# Patient Record
Sex: Male | Born: 1975 | Race: White | Hispanic: No | Marital: Married | Smoking: Never smoker
Health system: Southern US, Community
[De-identification: ages and names within clinical notes are randomized; demographics above are authoritative.]

## PROBLEM LIST (undated history)

## (undated) DIAGNOSIS — G4730 Sleep apnea, unspecified: Secondary | ICD-10-CM

## (undated) DIAGNOSIS — R030 Elevated blood-pressure reading, without diagnosis of hypertension: Secondary | ICD-10-CM

## (undated) DIAGNOSIS — M1990 Unspecified osteoarthritis, unspecified site: Secondary | ICD-10-CM

## (undated) DIAGNOSIS — K429 Umbilical hernia without obstruction or gangrene: Secondary | ICD-10-CM

## (undated) DIAGNOSIS — R519 Headache, unspecified: Secondary | ICD-10-CM

## (undated) DIAGNOSIS — J45909 Unspecified asthma, uncomplicated: Secondary | ICD-10-CM

## (undated) HISTORY — PX: BACK SURGERY: SHX140

---

## 2004-11-19 ENCOUNTER — Ambulatory Visit: Payer: Self-pay | Admitting: Family Medicine

## 2006-10-22 ENCOUNTER — Ambulatory Visit: Payer: Self-pay | Admitting: Family Medicine

## 2006-10-27 ENCOUNTER — Ambulatory Visit: Payer: Self-pay | Admitting: Family Medicine

## 2007-02-18 ENCOUNTER — Ambulatory Visit: Payer: Self-pay

## 2015-01-01 ENCOUNTER — Other Ambulatory Visit: Payer: Self-pay | Admitting: Family Medicine

## 2015-01-01 DIAGNOSIS — M5442 Lumbago with sciatica, left side: Secondary | ICD-10-CM

## 2015-01-08 ENCOUNTER — Ambulatory Visit
Admission: RE | Admit: 2015-01-08 | Discharge: 2015-01-08 | Disposition: A | Discharge status: Home / Self Care | Payer: Managed Care, Other (non HMO) | Source: Ambulatory Visit | Attending: Family Medicine | Admitting: Family Medicine

## 2015-01-08 DIAGNOSIS — M5126 Other intervertebral disc displacement, lumbar region: Secondary | ICD-10-CM | POA: Diagnosis not present

## 2015-01-08 DIAGNOSIS — M5442 Lumbago with sciatica, left side: Secondary | ICD-10-CM | POA: Diagnosis present

## 2015-09-23 ENCOUNTER — Ambulatory Visit
Admission: RE | Admit: 2015-09-23 | Discharge: 2015-09-23 | Disposition: A | Discharge status: Home / Self Care | Payer: Managed Care, Other (non HMO) | Source: Ambulatory Visit | Attending: Unknown Physician Specialty | Admitting: Unknown Physician Specialty

## 2015-09-23 ENCOUNTER — Other Ambulatory Visit: Payer: Self-pay | Admitting: Unknown Physician Specialty

## 2015-09-23 DIAGNOSIS — R1032 Left lower quadrant pain: Secondary | ICD-10-CM | POA: Diagnosis not present

## 2015-09-23 DIAGNOSIS — K4090 Unilateral inguinal hernia, without obstruction or gangrene, not specified as recurrent: Secondary | ICD-10-CM | POA: Insufficient documentation

## 2015-09-23 DIAGNOSIS — R918 Other nonspecific abnormal finding of lung field: Secondary | ICD-10-CM | POA: Diagnosis not present

## 2015-09-23 DIAGNOSIS — K760 Fatty (change of) liver, not elsewhere classified: Secondary | ICD-10-CM | POA: Diagnosis not present

## 2015-09-23 MED ORDER — IOHEXOL 300 MG/ML  SOLN
100.0000 mL | Freq: Once | INTRAMUSCULAR | Status: AC | PRN
Start: 2015-09-23 — End: 2015-09-23
  Administered 2015-09-23: 100 mL via INTRAVENOUS

## 2018-09-27 DIAGNOSIS — J069 Acute upper respiratory infection, unspecified: Secondary | ICD-10-CM | POA: Diagnosis not present

## 2019-01-05 ENCOUNTER — Other Ambulatory Visit: Payer: Self-pay | Admitting: Student

## 2019-01-05 DIAGNOSIS — M25561 Pain in right knee: Secondary | ICD-10-CM

## 2019-01-05 DIAGNOSIS — M25461 Effusion, right knee: Secondary | ICD-10-CM

## 2019-01-17 ENCOUNTER — Ambulatory Visit
Admission: RE | Admit: 2019-01-17 | Discharge: 2019-01-17 | Disposition: A | Discharge status: Home / Self Care | Payer: Commercial Managed Care - PPO | Source: Ambulatory Visit | Attending: Student | Admitting: Student

## 2019-01-17 ENCOUNTER — Other Ambulatory Visit: Payer: Self-pay

## 2019-01-17 DIAGNOSIS — M25461 Effusion, right knee: Secondary | ICD-10-CM | POA: Insufficient documentation

## 2019-01-17 DIAGNOSIS — M25561 Pain in right knee: Secondary | ICD-10-CM | POA: Diagnosis present

## 2019-02-16 ENCOUNTER — Other Ambulatory Visit
Admission: RE | Admit: 2019-02-16 | Discharge: 2019-02-16 | Disposition: A | Discharge status: Home / Self Care | Payer: Commercial Managed Care - PPO | Source: Ambulatory Visit | Attending: Sports Medicine | Admitting: Sports Medicine

## 2019-02-16 DIAGNOSIS — M25461 Effusion, right knee: Secondary | ICD-10-CM | POA: Diagnosis not present

## 2019-02-16 LAB — SYNOVIAL CELL COUNT + DIFF, W/ CRYSTALS
Crystals, Fluid: NONE SEEN
Eosinophils-Synovial: 0 %
Lymphocytes-Synovial Fld: 80 %
Monocyte-Macrophage-Synovial Fluid: 10 %
Neutrophil, Synovial: 10 %
WBC, Synovial: 66 /mm3 (ref 0–200)

## 2019-02-20 LAB — BODY FLUID CULTURE: Culture: NO GROWTH

## 2019-03-03 ENCOUNTER — Encounter
Admission: RE | Admit: 2019-03-03 | Discharge: 2019-03-03 | Disposition: A | Discharge status: Home / Self Care | Payer: Commercial Managed Care - PPO | Source: Ambulatory Visit | Attending: Orthopedic Surgery | Admitting: Orthopedic Surgery

## 2019-03-03 ENCOUNTER — Other Ambulatory Visit: Payer: Self-pay

## 2019-03-03 DIAGNOSIS — Z01818 Encounter for other preprocedural examination: Secondary | ICD-10-CM | POA: Insufficient documentation

## 2019-03-03 DIAGNOSIS — I4510 Unspecified right bundle-branch block: Secondary | ICD-10-CM | POA: Insufficient documentation

## 2019-03-03 DIAGNOSIS — R030 Elevated blood-pressure reading, without diagnosis of hypertension: Secondary | ICD-10-CM | POA: Insufficient documentation

## 2019-03-03 HISTORY — DX: Sleep apnea, unspecified: G47.30

## 2019-03-03 HISTORY — DX: Elevated blood-pressure reading, without diagnosis of hypertension: R03.0

## 2019-03-03 HISTORY — DX: Unspecified osteoarthritis, unspecified site: M19.90

## 2019-03-03 HISTORY — DX: Umbilical hernia without obstruction or gangrene: K42.9

## 2019-03-03 HISTORY — DX: Headache, unspecified: R51.9

## 2019-03-03 HISTORY — DX: Unspecified asthma, uncomplicated: J45.909

## 2019-03-03 LAB — BASIC METABOLIC PANEL
Anion gap: 8 (ref 5–15)
BUN: 10 mg/dL (ref 6–20)
CO2: 24 mmol/L (ref 22–32)
Calcium: 8.8 mg/dL — ABNORMAL LOW (ref 8.9–10.3)
Chloride: 104 mmol/L (ref 98–111)
Creatinine, Ser: 0.78 mg/dL (ref 0.61–1.24)
GFR calc Af Amer: >60 mL/min (ref >60)
GFR calc non Af Amer: >60 mL/min (ref >60)
Glucose, Bld: 121 mg/dL — ABNORMAL HIGH (ref 70–99)
Potassium: 3.7 mmol/L (ref 3.5–5.1)
Sodium: 136 mmol/L (ref 135–145)

## 2019-03-03 NOTE — Patient Instructions (Signed)
Your procedure is scheduled on: 03-10-19 FRIDAY Report to Same Day Surgery 2nd floor medical mall [facility] Entrance-take elevator on left to 2nd floor.  Check in with surgery information desk.) To find out your arrival time please call [phone] between 1PM - 3PM on 03-09-19 THURSDAY  Remember: Instructions that are not followed completely may result in serious medical risk, up to and including death, or upon the discretion of your surgeon and anesthesiologist your surgery may need to be rescheduled.    _x___ 1. Do not eat food after midnight the night before your procedure. NO GUM OR CANDY AFTER MIDNIGHT. You may drink clear liquids up to 2 hours before you are scheduled to arrive at the hospital for your procedure.  Do not drink clear liquids within 2 hours of your scheduled arrival to the hospital.  Clear liquids include  --Water or Apple juice without pulp  --Gatorade  --Black Coffee or Clear Tea (No milk, no creamers, do not add anything to the coffee or Tea   ____Ensure clear carbohydrate drink on the way to the hospital for bariatric patients  ____Ensure clear carbohydrate drink 3 hours before surgery.     __x__ 2. No Alcohol for 24 hours before or after surgery.   __x__3. No Smoking or e-cigarettes for 24 prior to surgery.  Do not use any chewable tobacco products for at least 6 hour prior to surgery   ____  4. Bring all medications with you on the day of surgery if instructed.    __x__ 5. Notify your doctor if there is any change in your medical condition     (cold, fever, infections).    x___6. On the morning of surgery brush your teeth with toothpaste and water.  You may rinse your mouth with mouth wash if you wish.  Do not swallow any toothpaste or mouthwash.   Do not wear jewelry, make-up, hairpins, clips or nail polish.  Do not wear lotions, powders, or perfumes.   Do not shave 48 hours prior to surgery. Men may shave face and neck.  Do not bring valuables to  the hospital.    [facility] is not responsible for any belongings or valuables.               Contacts, dentures or bridgework may not be worn into surgery.  Leave your suitcase in the car. After surgery it may be brought to your room.  For patients admitted to the hospital, discharge time is determined by your treatment team.  _  Patients discharged the day of surgery will not be allowed to drive home.  You will need someone to drive you home and stay with you the night of your procedure.    Please read over the following fact sheets that you were given:   [facility] Preparing for Surgery  ____ TAKE THE FOLLOWING MEDICATION THE MORNING OF SURGERY WITH A SMALL SIP OF WATER. These include:  1. NONE  2.  3.  4.  5.  6.  ____Fleets enema or Magnesium Citrate as directed.   _x___ Use CHG Soap or sage wipes as directed on instruction sheet   ____ Use inhalers on the day of surgery and bring to hospital day of surgery  ____ Stop Metformin and Janumet 2 days prior to surgery.    ____ Take 1/2 of usual insulin dose the night before surgery and none on the morning surgery.   ____ Follow recommendations from Cardiologist, Pulmonologist or PCP regarding stopping  Aspirin, Coumadin, Plavix ,Eliquis, Effient, or Pradaxa, and Pletal.  X____Stop Anti-inflammatories such as Advil, Aleve, Ibuprofen, Motrin, Naproxen, Naprosyn, Goodies powders or aspirin products  NOW-OK to take Tylenol    ____ Stop supplements until after surgery.     _X___ Bring C-Pap to the hospital.

## 2019-03-07 ENCOUNTER — Other Ambulatory Visit
Admission: RE | Admit: 2019-03-07 | Discharge: 2019-03-07 | Disposition: A | Discharge status: Home / Self Care | Payer: Commercial Managed Care - PPO | Source: Ambulatory Visit | Attending: Orthopedic Surgery | Admitting: Orthopedic Surgery

## 2019-03-07 ENCOUNTER — Other Ambulatory Visit: Payer: Self-pay

## 2019-03-07 DIAGNOSIS — Z01812 Encounter for preprocedural laboratory examination: Secondary | ICD-10-CM | POA: Diagnosis present

## 2019-03-07 DIAGNOSIS — M25561 Pain in right knee: Secondary | ICD-10-CM | POA: Insufficient documentation

## 2019-03-07 DIAGNOSIS — G8929 Other chronic pain: Secondary | ICD-10-CM | POA: Insufficient documentation

## 2019-03-07 DIAGNOSIS — M7651 Patellar tendinitis, right knee: Secondary | ICD-10-CM | POA: Diagnosis not present

## 2019-03-07 DIAGNOSIS — M2241 Chondromalacia patellae, right knee: Secondary | ICD-10-CM | POA: Diagnosis not present

## 2019-03-07 DIAGNOSIS — Z20828 Contact with and (suspected) exposure to other viral communicable diseases: Secondary | ICD-10-CM | POA: Diagnosis not present

## 2019-03-07 LAB — SARS CORONAVIRUS 2 (TAT 6-24 HRS): SARS Coronavirus 2: NEGATIVE

## 2019-03-09 MED ORDER — DEXTROSE 5 % IV SOLN
3.0000 g | Freq: Once | INTRAVENOUS | Status: AC
  Administered 2019-03-10: 3 g via INTRAVENOUS
  Filled 2019-03-09: qty 3
  Filled 2019-03-09: qty 3000

## 2019-03-10 ENCOUNTER — Encounter
Admission: RE | Disposition: A | Discharge status: Home / Self Care | Payer: Self-pay | Source: Home / Self Care | Attending: Orthopedic Surgery

## 2019-03-10 ENCOUNTER — Other Ambulatory Visit: Payer: Self-pay

## 2019-03-10 ENCOUNTER — Encounter: Payer: Self-pay | Admitting: Certified Registered Nurse Anesthetist

## 2019-03-10 ENCOUNTER — Ambulatory Visit: Payer: Commercial Managed Care - PPO | Admitting: Certified Registered Nurse Anesthetist

## 2019-03-10 ENCOUNTER — Ambulatory Visit
Admission: RE | Admit: 2019-03-10 | Discharge: 2019-03-10 | Disposition: A | Discharge status: Home / Self Care | Payer: Commercial Managed Care - PPO | Attending: Orthopedic Surgery | Admitting: Orthopedic Surgery

## 2019-03-10 DIAGNOSIS — I10 Essential (primary) hypertension: Secondary | ICD-10-CM | POA: Insufficient documentation

## 2019-03-10 DIAGNOSIS — M25561 Pain in right knee: Secondary | ICD-10-CM | POA: Insufficient documentation

## 2019-03-10 DIAGNOSIS — M2341 Loose body in knee, right knee: Secondary | ICD-10-CM | POA: Insufficient documentation

## 2019-03-10 DIAGNOSIS — E669 Obesity, unspecified: Secondary | ICD-10-CM | POA: Diagnosis not present

## 2019-03-10 DIAGNOSIS — Z6841 Body Mass Index (BMI) 40.0 and over, adult: Secondary | ICD-10-CM | POA: Insufficient documentation

## 2019-03-10 DIAGNOSIS — M2410 Other articular cartilage disorders, unspecified site: Secondary | ICD-10-CM | POA: Insufficient documentation

## 2019-03-10 DIAGNOSIS — J45909 Unspecified asthma, uncomplicated: Secondary | ICD-10-CM | POA: Diagnosis not present

## 2019-03-10 DIAGNOSIS — G4730 Sleep apnea, unspecified: Secondary | ICD-10-CM | POA: Diagnosis not present

## 2019-03-10 DIAGNOSIS — Z79899 Other long term (current) drug therapy: Secondary | ICD-10-CM | POA: Diagnosis not present

## 2019-03-10 DIAGNOSIS — G8929 Other chronic pain: Secondary | ICD-10-CM | POA: Diagnosis not present

## 2019-03-10 HISTORY — PX: KNEE ARTHROSCOPY WITH MEDIAL MENISECTOMY: SHX5651

## 2019-03-10 SURGERY — ARTHROSCOPY, KNEE, WITH MEDIAL MENISCECTOMY
Anesthesia: General | Site: Knee | Laterality: Right

## 2019-03-10 MED ORDER — SUCCINYLCHOLINE CHLORIDE 20 MG/ML IJ SOLN
INTRAMUSCULAR | Status: AC
  Filled 2019-03-10: qty 1

## 2019-03-10 MED ORDER — FENTANYL CITRATE (PF) 100 MCG/2ML IJ SOLN
INTRAMUSCULAR | Status: AC
  Administered 2019-03-10: 11:00:00 25 ug via INTRAVENOUS
  Filled 2019-03-10: qty 2

## 2019-03-10 MED ORDER — ROCURONIUM BROMIDE 50 MG/5ML IV SOLN
INTRAVENOUS | Status: AC
  Filled 2019-03-10: qty 1

## 2019-03-10 MED ORDER — ONDANSETRON 4 MG PO TBDP: 4.0000 mg | ORAL_TABLET | Freq: Three times a day (TID) | ORAL | 0 refills | Status: AC | PRN

## 2019-03-10 MED ORDER — ROCURONIUM BROMIDE 100 MG/10ML IV SOLN
INTRAVENOUS | Status: DC | PRN
  Administered 2019-03-10: 5 mg via INTRAVENOUS
  Administered 2019-03-10: 45 mg via INTRAVENOUS
  Administered 2019-03-10 (×2): 20 mg via INTRAVENOUS

## 2019-03-10 MED ORDER — SUGAMMADEX SODIUM 500 MG/5ML IV SOLN
INTRAVENOUS | Status: DC | PRN
  Administered 2019-03-10: 500 mg via INTRAVENOUS

## 2019-03-10 MED ORDER — KETOROLAC TROMETHAMINE 30 MG/ML IJ SOLN
INTRAMUSCULAR | Status: AC
  Filled 2019-03-10: qty 1

## 2019-03-10 MED ORDER — FENTANYL CITRATE (PF) 100 MCG/2ML IJ SOLN
25.0000 ug | INTRAMUSCULAR | Status: DC | PRN
  Administered 2019-03-10: 11:00:00 25 ug via INTRAVENOUS
  Administered 2019-03-10: 50 ug via INTRAVENOUS
  Administered 2019-03-10 (×3): 25 ug via INTRAVENOUS

## 2019-03-10 MED ORDER — FENTANYL CITRATE (PF) 100 MCG/2ML IJ SOLN
INTRAMUSCULAR | Status: AC
  Administered 2019-03-10: 25 ug via INTRAVENOUS
  Filled 2019-03-10: qty 2

## 2019-03-10 MED ORDER — FAMOTIDINE 20 MG PO TABS
ORAL_TABLET | ORAL | Status: AC
  Administered 2019-03-10: 09:00:00 20 mg via ORAL
  Filled 2019-03-10: qty 1

## 2019-03-10 MED ORDER — OXYCODONE HCL 5 MG PO TABS
5.0000 mg | ORAL_TABLET | Freq: Once | ORAL | Status: AC | PRN
  Administered 2019-03-10: 12:00:00 5 mg via ORAL

## 2019-03-10 MED ORDER — FENTANYL CITRATE (PF) 100 MCG/2ML IJ SOLN
25.0000 ug | INTRAMUSCULAR | Status: AC | PRN
  Administered 2019-03-10 (×4): 25 ug via INTRAVENOUS

## 2019-03-10 MED ORDER — ACETAMINOPHEN 10 MG/ML IV SOLN
INTRAVENOUS | Status: AC
  Filled 2019-03-10: qty 100

## 2019-03-10 MED ORDER — DEXAMETHASONE SODIUM PHOSPHATE 10 MG/ML IJ SOLN
INTRAMUSCULAR | Status: AC
  Filled 2019-03-10: qty 1

## 2019-03-10 MED ORDER — ONDANSETRON HCL 4 MG/2ML IJ SOLN
INTRAMUSCULAR | Status: AC
  Filled 2019-03-10: qty 2

## 2019-03-10 MED ORDER — ACETAMINOPHEN 10 MG/ML IV SOLN
INTRAVENOUS | Status: DC | PRN
  Administered 2019-03-10: 1000 mg via INTRAVENOUS

## 2019-03-10 MED ORDER — LIDOCAINE HCL (CARDIAC) PF 100 MG/5ML IV SOSY
PREFILLED_SYRINGE | INTRAVENOUS | Status: DC | PRN
  Administered 2019-03-10: 100 mg via INTRAVENOUS

## 2019-03-10 MED ORDER — HYDROCODONE-ACETAMINOPHEN 5-325 MG PO TABS: 1.0000 | ORAL_TABLET | ORAL | 0 refills | Status: AC | PRN

## 2019-03-10 MED ORDER — IBUPROFEN 800 MG PO TABS: 800.0000 mg | ORAL_TABLET | Freq: Three times a day (TID) | ORAL | 1 refills | Status: AC

## 2019-03-10 MED ORDER — OXYCODONE HCL 5 MG PO TABS
ORAL_TABLET | ORAL | Status: AC
  Administered 2019-03-10: 5 mg via ORAL
  Filled 2019-03-10: qty 1

## 2019-03-10 MED ORDER — KETOROLAC TROMETHAMINE 30 MG/ML IJ SOLN
INTRAMUSCULAR | Status: DC | PRN
  Administered 2019-03-10: 30 mg via INTRAVENOUS

## 2019-03-10 MED ORDER — SUGAMMADEX SODIUM 500 MG/5ML IV SOLN
INTRAVENOUS | Status: AC
  Filled 2019-03-10: qty 5

## 2019-03-10 MED ORDER — BUPIVACAINE HCL (PF) 0.5 % IJ SOLN
INTRAMUSCULAR | Status: DC | PRN
  Administered 2019-03-10: 10 mL

## 2019-03-10 MED ORDER — SUCCINYLCHOLINE CHLORIDE 20 MG/ML IJ SOLN
INTRAMUSCULAR | Status: DC | PRN
  Administered 2019-03-10: 160 mg via INTRAVENOUS

## 2019-03-10 MED ORDER — MIDAZOLAM HCL 2 MG/2ML IJ SOLN
INTRAMUSCULAR | Status: AC
  Filled 2019-03-10: qty 2

## 2019-03-10 MED ORDER — LIDOCAINE-EPINEPHRINE 1 %-1:100000 IJ SOLN
INTRAMUSCULAR | Status: AC
  Filled 2019-03-10: qty 1

## 2019-03-10 MED ORDER — OXYCODONE HCL 5 MG/5ML PO SOLN: 5.0000 mg | Freq: Once | ORAL | Status: AC | PRN

## 2019-03-10 MED ORDER — FAMOTIDINE 20 MG PO TABS
20.0000 mg | ORAL_TABLET | Freq: Once | ORAL | Status: AC
  Administered 2019-03-10: 09:00:00 20 mg via ORAL

## 2019-03-10 MED ORDER — FENTANYL CITRATE (PF) 250 MCG/5ML IJ SOLN
INTRAMUSCULAR | Status: AC
  Filled 2019-03-10: qty 5

## 2019-03-10 MED ORDER — ASPIRIN EC 325 MG PO TBEC: 325.0000 mg | DELAYED_RELEASE_TABLET | Freq: Every day | ORAL | 0 refills | Status: AC

## 2019-03-10 MED ORDER — MIDAZOLAM HCL 2 MG/2ML IJ SOLN
INTRAMUSCULAR | Status: DC | PRN
  Administered 2019-03-10: 2 mg via INTRAVENOUS

## 2019-03-10 MED ORDER — ACETAMINOPHEN 500 MG PO TABS: 1000.0000 mg | ORAL_TABLET | Freq: Three times a day (TID) | ORAL | 2 refills | Status: AC

## 2019-03-10 MED ORDER — FENTANYL CITRATE (PF) 100 MCG/2ML IJ SOLN
INTRAMUSCULAR | Status: DC | PRN
  Administered 2019-03-10: 100 ug via INTRAVENOUS
  Administered 2019-03-10: 50 ug via INTRAVENOUS

## 2019-03-10 MED ORDER — LIDOCAINE HCL (PF) 2 % IJ SOLN
INTRAMUSCULAR | Status: AC
  Filled 2019-03-10: qty 10

## 2019-03-10 MED ORDER — LACTATED RINGERS IV SOLN
INTRAVENOUS | Status: DC
  Administered 2019-03-10: 09:00:00 via INTRAVENOUS

## 2019-03-10 MED ORDER — EPINEPHRINE PF 1 MG/ML IJ SOLN
INTRAMUSCULAR | Status: AC
  Filled 2019-03-10: qty 4

## 2019-03-10 MED ORDER — PROPOFOL 10 MG/ML IV BOLUS
INTRAVENOUS | Status: AC
  Filled 2019-03-10: qty 20

## 2019-03-10 MED ORDER — PROMETHAZINE HCL 25 MG/ML IJ SOLN: 6.2500 mg | INTRAMUSCULAR | Status: DC | PRN

## 2019-03-10 MED ORDER — BUPIVACAINE HCL (PF) 0.5 % IJ SOLN
INTRAMUSCULAR | Status: AC
  Filled 2019-03-10: qty 30

## 2019-03-10 MED ORDER — DEXAMETHASONE SODIUM PHOSPHATE 10 MG/ML IJ SOLN
INTRAMUSCULAR | Status: DC | PRN
  Administered 2019-03-10: 10 mg via INTRAVENOUS

## 2019-03-10 MED ORDER — MEPERIDINE HCL 50 MG/ML IJ SOLN: 6.2500 mg | INTRAMUSCULAR | Status: DC | PRN

## 2019-03-10 MED ORDER — PROPOFOL 10 MG/ML IV BOLUS
INTRAVENOUS | Status: DC | PRN
  Administered 2019-03-10: 200 mg via INTRAVENOUS

## 2019-03-10 MED ORDER — LACTATED RINGERS IV SOLN
INTRAVENOUS | Status: DC | PRN
  Administered 2019-03-10: 4 mL

## 2019-03-10 MED ORDER — LIDOCAINE-EPINEPHRINE 1 %-1:100000 IJ SOLN
INTRAMUSCULAR | Status: DC | PRN
  Administered 2019-03-10: 10 mL

## 2019-03-10 SURGICAL SUPPLY — 58 items
"PENCIL ELECTRO HAND CTR " (MISCELLANEOUS) IMPLANT
ADAPTER IRRIG TUBE 2 SPIKE SOL (ADAPTER) ×6 IMPLANT
BLADE SURG SZ11 CARB STEEL (BLADE) ×3 IMPLANT
BNDG COHESIVE 6X5 TAN STRL LF (GAUZE/BANDAGES/DRESSINGS) ×5 IMPLANT
BNDG ELASTIC 6X5.8 VLCR STR LF (GAUZE/BANDAGES/DRESSINGS) ×3 IMPLANT
BNDG ESMARK 6X12 TAN STRL LF (GAUZE/BANDAGES/DRESSINGS) ×3 IMPLANT
BUR RADIUS 3.5 (BURR) ×2 IMPLANT
BUR RADIUS 4.0X18.5 (BURR) IMPLANT
CAST PADDING 6X4YD ST 30248 (SOFTGOODS) ×2
CHLORAPREP W/TINT 26 (MISCELLANEOUS) ×3 IMPLANT
CLOSURE WOUND 1/2 X4 (GAUZE/BANDAGES/DRESSINGS)
COOLER POLAR GLACIER W/PUMP (MISCELLANEOUS) ×3 IMPLANT
COVER WAND RF STERILE (DRAPES) ×3 IMPLANT
CUFF TOURN SGL QUICK 24 (TOURNIQUET CUFF)
CUFF TOURN SGL QUICK 30 (TOURNIQUET CUFF)
CUFF TOURN SGL QUICK 42 (TOURNIQUET CUFF) ×2 IMPLANT
CUFF TRNQT CYL 24X4X16.5-23 (TOURNIQUET CUFF) IMPLANT
CUFF TRNQT CYL 30X4X21-28X (TOURNIQUET CUFF) IMPLANT
DEVICE SUCT BLK HOLE OR FLOOR (MISCELLANEOUS) ×3 IMPLANT
DRAPE LEGGINS SURG 28X43 STRL (DRAPES) IMPLANT
DRAPE SPLIT 6X30 W/TAPE (DRAPES) ×3 IMPLANT
ELECT REM PT RETURN 9FT ADLT (ELECTROSURGICAL)
ELECTRODE REM PT RTRN 9FT ADLT (ELECTROSURGICAL) IMPLANT
GAUZE SPONGE 4X4 12PLY STRL (GAUZE/BANDAGES/DRESSINGS) ×3 IMPLANT
GLOVE BIOGEL PI IND STRL 8 (GLOVE) ×1 IMPLANT
GLOVE BIOGEL PI INDICATOR 8 (GLOVE) ×2
GLOVE SURG ORTHO 8.0 STRL STRW (GLOVE) ×6 IMPLANT
GOWN STRL REUS W/ TWL LRG LVL3 (GOWN DISPOSABLE) ×1 IMPLANT
GOWN STRL REUS W/ TWL XL LVL3 (GOWN DISPOSABLE) ×1 IMPLANT
GOWN STRL REUS W/TWL LRG LVL3 (GOWN DISPOSABLE) ×2
GOWN STRL REUS W/TWL XL LVL3 (GOWN DISPOSABLE) ×2
IV LACTATED RINGER IRRG 3000ML (IV SOLUTION) ×22
IV LR IRRIG 3000ML ARTHROMATIC (IV SOLUTION) ×4 IMPLANT
KIT TURNOVER KIT A (KITS) ×3 IMPLANT
MANIFOLD NEPTUNE II (INSTRUMENTS) ×3 IMPLANT
MAT ABSORB  FLUID 56X50 GRAY (MISCELLANEOUS) ×4
MAT ABSORB FLUID 56X50 GRAY (MISCELLANEOUS) ×2 IMPLANT
NDL MAYO CATGUT SZ5 (NEEDLE)
NDL SUT 5 .5 CRC TPR PNT MAYO (NEEDLE) IMPLANT
NEEDLE HYPO 22GX1.5 SAFETY (NEEDLE) ×3 IMPLANT
PACK ARTHROSCOPY KNEE (MISCELLANEOUS) ×3 IMPLANT
PAD ABD DERMACEA PRESS 5X9 (GAUZE/BANDAGES/DRESSINGS) ×6 IMPLANT
PAD WRAPON POLAR KNEE (MISCELLANEOUS) ×1 IMPLANT
PADDING CAST COTTON 6X4 ST (SOFTGOODS) ×1 IMPLANT
PENCIL ELECTRO HAND CTR (MISCELLANEOUS) IMPLANT
SET TUBE SUCT SHAVER OUTFL 24K (TUBING) ×3 IMPLANT
SET TUBE TIP INTRA-ARTICULAR (MISCELLANEOUS) ×3 IMPLANT
STRIP CLOSURE SKIN 1/2X4 (GAUZE/BANDAGES/DRESSINGS) IMPLANT
SUT ETHILON 3-0 FS-10 30 BLK (SUTURE) ×3
SUT MNCRL AB 4-0 PS2 18 (SUTURE) IMPLANT
SUT VIC AB 0 CT2 27 (SUTURE) IMPLANT
SUT VIC AB 2-0 CT2 27 (SUTURE) IMPLANT
SUTURE EHLN 3-0 FS-10 30 BLK (SUTURE) ×1 IMPLANT
TOWEL OR 17X26 4PK STRL BLUE (TOWEL DISPOSABLE) ×6 IMPLANT
TUBING ARTHRO INFLOW-ONLY STRL (TUBING) ×3 IMPLANT
WAND HAND CNTRL MULTIVAC 50 (MISCELLANEOUS) IMPLANT
WAND WEREWOLF FLOW 90D (MISCELLANEOUS) IMPLANT
WRAPON POLAR PAD KNEE (MISCELLANEOUS) ×3

## 2019-03-10 NOTE — Anesthesia Procedure Notes (Signed)
Procedure Name: Intubation Date/Time: 03/10/2019 9:18 AM Performed by: Eben Burow, CRNA Pre-anesthesia Checklist: Patient identified, Emergency Drugs available, Suction available and Patient being monitored Patient Re-evaluated:Patient Re-evaluated prior to induction Oxygen Delivery Method: Circle system utilized Preoxygenation: Pre-oxygenation with 100% oxygen Induction Type: IV induction Ventilation: Mask ventilation without difficulty Laryngoscope Size: McGraph and 4 Grade View: Grade I Tube type: Oral Tube size: 8.0 mm Number of attempts: 1 Airway Equipment and Method: Stylet,  Oral airway and Video-laryngoscopy Placement Confirmation: ETT inserted through vocal cords under direct vision,  positive ETCO2 and breath sounds checked- equal and bilateral Secured at: 23 cm Tube secured with: Tape Dental Injury: Teeth and Oropharynx as per pre-operative assessment

## 2019-03-10 NOTE — Transfer of Care (Signed)
Immediate Anesthesia Transfer of Care Note  Patient: Antonio Mcdaniel  Procedure(s) Performed: KNEE ARTHROSCOPY SYNOVECTOMY, POSSIBLE PATELLA MICROFRACTURE,MEDIAL MENISECTOMY, CHONDROPLASTY (Right Knee)  Patient Location: PACU  Anesthesia Type:General  Level of Consciousness: awake, alert , oriented and patient cooperative  Airway & Oxygen Therapy: Patient Spontanous Breathing and Patient connected to face mask oxygen  Post-op Assessment: Report given to RN and Post -op Vital signs reviewed and stable  Post vital signs: Reviewed and stable  Last Vitals:  Vitals Value Taken Time  BP 124/56 03/10/19 1053  Temp    Pulse 66 03/10/19 1054  Resp 16 03/10/19 1054  SpO2 98 % 03/10/19 1054  Vitals shown include unvalidated device data.  Last Pain:  Vitals:   03/10/19 0824  TempSrc: Tympanic  PainSc: 6          Complications: No apparent anesthesia complications

## 2019-03-10 NOTE — Anesthesia Post-op Follow-up Note (Signed)
Anesthesia QCDR form completed.        

## 2019-03-10 NOTE — Op Note (Signed)
Operative Note    SURGERY DATE: 03/10/2019   PRE-OP DIAGNOSIS:  1. Right knee patella chondral defect 2. Right knee thickened Hoffa's fat pad   POST-OP DIAGNOSIS:  1. Right knee patella, trochlea, and medial femoral condyle chondral defects 2. Right knee thickened Hoffa's fat pad 3. Right knee loose body   PROCEDURES:  1. Right knee arthroscopic chondroplasty of patella, trochlea, and medial femoral condyle 2.  Right knee arthroscopic removal of loose body 3.  Right knee partial synovectomy with Hoffa's fat pad debridement   SURGEON: Rosealee Albee, MD   ANESTHESIA: Gen   ESTIMATED BLOOD LOSS: minimal   TOTAL IV FLUIDS: per anesthesia   INDICATION(S): Antonio SMITHERMAN is a 43 y.o. male with knee pain that began in June 2020 after a buckling injury while going downstairs.  He underwent conservative management in the form of corticosteroid injection, physical therapy, medical management, and activity modifications.  He had persistent pain and is unable to work due to this pain.  An MRI was obtained.  It showed a chondral defect of the patella.  I had a lengthy discussion with the patient preoperatively regarding possible surgical interventions and the patient was in agreement to proceed.  He did understand that he may not have significant benefit from this procedure, especially given the lack of complete correlation of his symptoms and his pathology on MRI.  After discussion of risks, benefits, and alternatives to surgery, the patient elected to proceed.   OPERATIVE FINDINGS:    Examination under anesthesia: A careful examination under anesthesia was performed.  Passive range of motion was: Hyperextension: 1.  Extension: 0.  Flexion: 110.  Lachman: normal. Pivot Shift: normal.  Posterior drawer: normal.  Varus stability in full extension: normal.  Varus stability in 30 degrees of flexion: normal.  Valgus stability in full extension: normal.  Valgus stability in 30 degrees of flexion:  normal.   Intra-operative findings: A thorough arthroscopic examination of the knee was performed.  The findings are: 1. Suprapatellar pouch: Normal 2. Undersurface of median ridge: Grade 4 chondral defect measuring approximately 12 x 10 mm 3. Medial patellar facet:  Grade 1 softening 4. Lateral patellar facet: Grade 1 softening 5. Trochlea: Grade 4 chondral defect of the central trochlea measuring approximately 12 mm x 6 mm 6. Lateral gutter/popliteus tendon: Loose body measuring approximately 6 mm in lateral gutter/lateral compartment 7. Hoffa's fat pad: Inflamed with impingement medially 8. Medial gutter/plica: Normal 9. ACL: Normal 10. PCL: Normal 11. Medial meniscus: Normal 12. Medial compartment cartilage: Focal grade 4 chondral defect of the medial femoral condyle measuring approximately 12 x 12 mm.  Grade 1 degenerative changes to the medial tibial plateau 13. Lateral meniscus: Normal 14. Lateral compartment cartilage:  Normal   OPERATIVE REPORT:     I identified Antonio Mcdaniel in the pre-operative holding area. I marked the operative knee with my initials. I reviewed the risks and benefits of the proposed surgical intervention and the patient (and/or patient's guardian) wished to proceed. The patient was transferred to the operative suite and placed in the supine position with all bony prominences padded.  Anesthesia was administered. Appropriate IV antibiotics were administered prior to incision. The extremity was then prepped and draped in standard fashion. A time out was performed confirming the correct extremity, correct patient, and correct procedure.   Arthroscopy portals were marked. Local anesthetic was injected to the planned portal sites. The anterolateral portal was established with an 11 blade.  The arthroscope was placed in the anterolateral portal and then into the suprapatellar pouch.   Next the medial portal was established under needle localization. The MCL  was pie-crusted to improve visualization of the posterior horn to ensure there is no subtle medial meniscus root tear. A diagnostic knee scope was completed with the above findings. The intra-articular loose body was removed with an oscillating shaver.  It measured approximately 6 mm in length.Next, a partial synovectomy consisting of significant debridement of Hoffa's fat pad was performed with a combination of oscillating shaver and ArthroCare wand.  There was no impingement after this debridement.  A chondroplasty was performed of the medial compartment, trochlea, and patella such that there were stable cartilage edges without any loose fragments of cartilage.   Residual synovitic changes were debrided and coagulated with an ArthroCare wand.  Arthroscopic fluid was removed from the joint.   The portals were closed with 3-0 Nylon suture. Sterile dressings included Xeroform, 4x4s, Sof-Rol, and Bias wrap. A Polarcare was placed.  The patient was then awakened and taken to the PACU hemodynamically stable without complication.     POSTOPERATIVE PLAN: The patient will be discharged home today once they meet PACU criteria. Aspirin 325 mg daily was prescribed for 2 weeks for DVT prophylaxis.  Physical therapy will start on POD#3-4. Weight-bearing as tolerated. Follow up in 2 weeks per protocol.

## 2019-03-10 NOTE — H&P (Signed)
Paper H&P to be scanned into permanent record. H&P reviewed. No significant changes noted.  

## 2019-03-10 NOTE — Anesthesia Postprocedure Evaluation (Signed)
Anesthesia Post Note  Patient: JEPTHA HINNENKAMP  Procedure(s) Performed: right knee arthroscopy,arthroscopic patella medial femoral chrondoplasty lesion, removal of loose body (Right Knee)  Patient location during evaluation: PACU Anesthesia Type: General Level of consciousness: awake and alert and oriented Pain management: pain level controlled Vital Signs Assessment: post-procedure vital signs reviewed and stable Respiratory status: spontaneous breathing, nonlabored ventilation and respiratory function stable Cardiovascular status: blood pressure returned to baseline and stable Postop Assessment: no signs of nausea or vomiting Anesthetic complications: no     Last Vitals:  Vitals:   03/10/19 1217 03/10/19 1300  BP: 135/69 133/76  Pulse: 84 85  Resp: 18 20  Temp: (!) 36.2 C   SpO2: 94% 94%    Last Pain:  Vitals:   03/10/19 1300  TempSrc:   PainSc: 2                  Marium Ragan

## 2019-03-10 NOTE — Anesthesia Preprocedure Evaluation (Signed)
Anesthesia Evaluation  Patient identified by MRN, date of birth, ID band Patient awake    Reviewed: Allergy & Precautions, NPO status , Patient's Chart, lab work & pertinent test results  History of Anesthesia Complications Negative for: history of anesthetic complications  Airway Mallampati: III  TM Distance: >3 FB Neck ROM: Full    Dental no notable dental hx.    Pulmonary asthma , sleep apnea and Continuous Positive Airway Pressure Ventilation ,    breath sounds clear to auscultation- rhonchi (-) wheezing      Cardiovascular hypertension, Pt. on medications (-) CAD, (-) Past MI, (-) Cardiac Stents and (-) CABG  Rhythm:Regular Rate:Normal - Systolic murmurs and - Diastolic murmurs    Neuro/Psych  Headaches, neg Seizures negative psych ROS   GI/Hepatic negative GI ROS, Neg liver ROS,   Endo/Other  negative endocrine ROSneg diabetes  Renal/GU negative Renal ROS     Musculoskeletal  (+) Arthritis ,   Abdominal (+) + obese,   Peds  Hematology negative hematology ROS (+)   Anesthesia Other Findings Past Medical History: No date: Arthritis No date: Asthma     Comment:  mild-well controlled No date: Elevated blood pressure, situational     Comment:  due to pain in knee No date: Headache     Comment:   migraines x 1 No date: Sleep apnea     Comment:  cpap No date: Umbilical hernia   Reproductive/Obstetrics                             Anesthesia Physical Anesthesia Plan  ASA: II  Anesthesia Plan: General   Post-op Pain Management:    Induction: Intravenous  PONV Risk Score and Plan: 1 and Ondansetron, Dexamethasone and Midazolam  Airway Management Planned: Oral ETT  Additional Equipment:   Intra-op Plan:   Post-operative Plan: Extubation in OR  Informed Consent: I have reviewed the patients History and Physical, chart, labs and discussed the procedure including the risks,  benefits and alternatives for the proposed anesthesia with the patient or authorized representative who has indicated his/her understanding and acceptance.     Dental advisory given  Plan Discussed with: CRNA and Anesthesiologist  Anesthesia Plan Comments:         Anesthesia Quick Evaluation

## 2019-03-10 NOTE — Discharge Instructions (Signed)
Arthroscopic Knee Surgery   Post-Op Instructions   1. Bracing or crutches: Crutches will be provided at the time of discharge from the surgery center if you do not already have them.   2. Ice: You may be provided with a device [employer]) that allows you to ice the affected area effectively. Otherwise you can ice manually.    3. Driving:  Plan on not driving for at least one week. Please note that you are advised NOT to drive while taking narcotic pain medications as you may be impaired and unsafe to drive.   4. Activity: Ankle pumps several times an hour while awake to prevent blood clots. Weight bearing: as tolerated. Use crutches/walker for as needed (usually ~1 week or less) until pain allows you to ambulate without a limp. Bending and straightening the knee is unlimited. Elevate knee above heart level as much as possible for one week. Avoid standing more than 5 minutes (consecutively) for the first week.  Avoid long distance travel for 2 weeks.   5. Medications:  - You have been provided a prescription for narcotic pain medicine. After surgery, take 1-2 narcotic tablets every 4 hours if needed for severe pain.  - You may take up to 3000mg /day of tylenol (acetaminophen). You can take 1000mg  3x/day. Please check your narcotic. If you have acetaminophen in your narcotic (each tablet will be 325mg ), be careful not to exceed a total of 3000mg /day of acetaminophen.  - A prescription for anti-nausea medication will be provided in case the narcotic medicine causes nausea - take 1 tablet every 6 hours only if nauseated.  - Take ibuprofen 800 mg every 8 hours WITH food to reduce post-operative knee swelling. DO NOT STOP IBUPROFEN POST-OP UNTIL INSTRUCTED TO DO SO at first post-op office visit (10-14 days after surgery). However, please discontinue if you have any abdominal discomfort after taking this.  - Take enteric coated aspirin 325 mg once daily for 2 weeks to prevent blood clots.   6. Bandages:  The physical therapist should change the bandages at the first post-op appointment. If needed, the dressing supplies have been provided to you.   7. Physical Therapy: 1-2 times per week for 6 weeks. Therapy typically starts on post operative Day 3 or 4. You have been provided an order for physical therapy. The therapist will provide home exercises.   8. Work: May return to full work usually around 2 weeks after 1st post-operative visit. May do light duty/desk job in approximately 1-2 weeks when off of narcotics, pain is well-controlled, and swelling has decreased. Labor intensive jobs may require 4-6 weeks to return.      9. Post-Op Appointments: Your first post-op appointment will be with Dr. in approximately 2 weeks time.    If you find that they have not been scheduled please call the Orthopaedic Appointment front desk at [phone].   AMBULATORY SURGERY  DISCHARGE INSTRUCTIONS   1) The drugs that you were given will stay in your system until tomorrow so for the next 24 hours you should not:  A) Drive an automobile B) Make any legal decisions C) Drink any alcoholic beverage   2) You may resume regular meals tomorrow.  Today it is better to start with liquids and gradually work up to solid foods.  You may eat anything you prefer, but it is better to start with liquids, then soup and crackers, and gradually work up to solid foods.   3) Please notify your doctor immediately if you  have any unusual bleeding, trouble breathing, redness and pain at the surgery site, drainage, fever, or pain not relieved by medication.   4) Additional Instructions:  Please contact your physician with any problems or Same Day Surgery at [phone], Monday through Friday 6 am to 4 pm, or [place] at [facility] number at [phone].

## 2019-03-11 ENCOUNTER — Encounter: Payer: Self-pay | Admitting: Orthopedic Surgery

## 2019-12-16 IMAGING — MR MRI OF THE RIGHT KNEE WITHOUT CONTRAST
6 series · 40 of 40 positions shown · non-contrast
Comparison: None.

CLINICAL DATA: The patient suffered a right knee injury when his
knee buckled going down stairs 11/26/2018. Continued pain.

EXAM:
MRI OF THE RIGHT KNEE WITHOUT CONTRAST
TECHNIQUE: Multiplanar, multisequence MR imaging of the knee was performed. No
intravenous contrast was administered.

[Series 9: T1 · coronal · right · 4.0mm · 0.47mm/px · 7 of 32 slices shown]
[im 1/32]
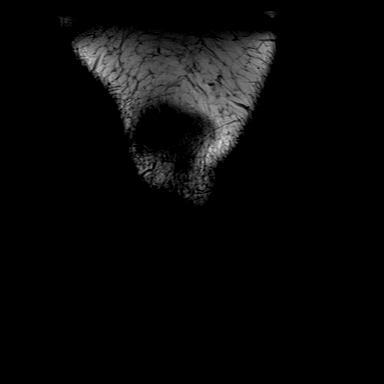
[im 6/32]
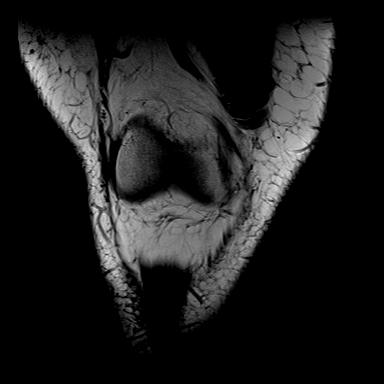
[im 11/32]
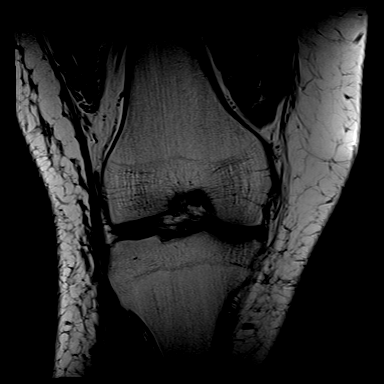
[im 16/32]
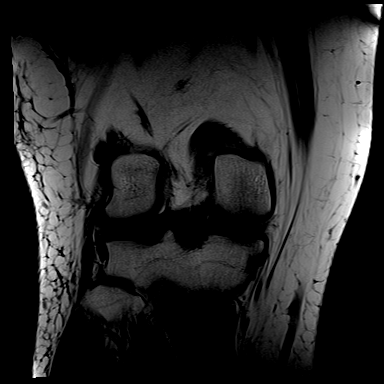
[im 21/32]
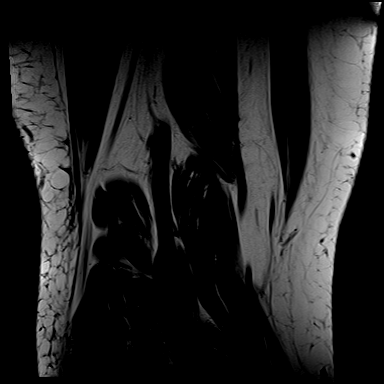
[im 26/32]
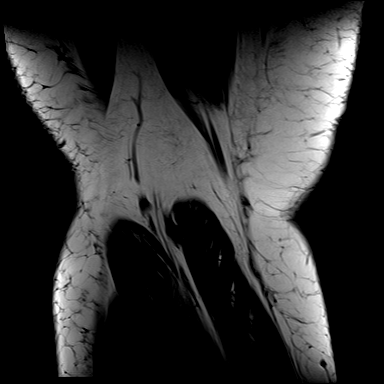
[im 32/32]
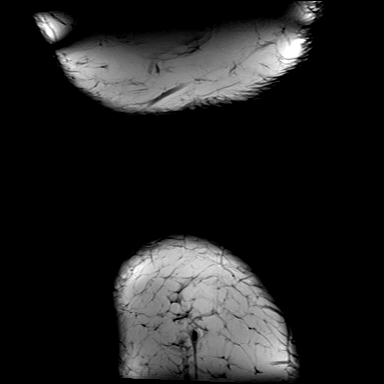

[Series 10: T2 fat-sat · coronal · right · 4.0mm · 0.70mm/px · 6 of 31 slices shown (1 of 3)]
[im 1/31]
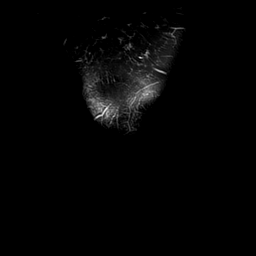
[im 7/31]
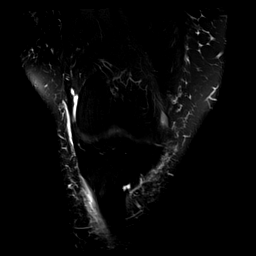
[im 13/31]
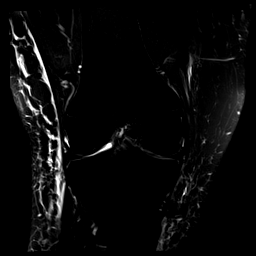
[im 19/31]
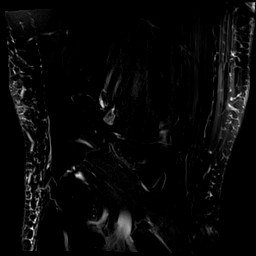
[im 25/31]
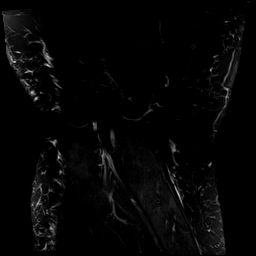
[im 31/31]
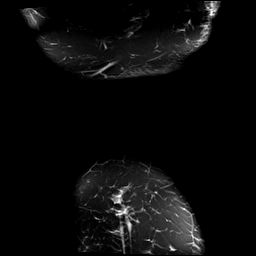

[Series 11: PD fat-sat · coronal · right · 4.0mm · 0.70mm/px · 6 of 31 slices shown (1 of 2)]
[im 1/31]
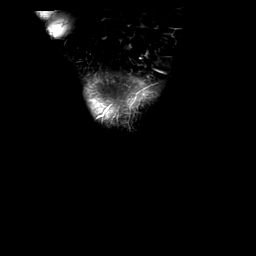
[im 7/31]
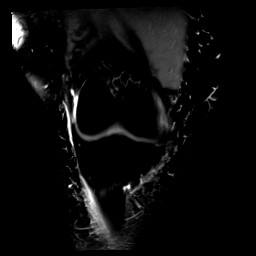
[im 13/31]
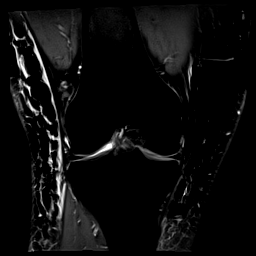
[im 19/31]
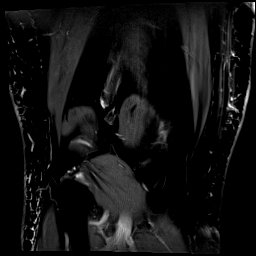
[im 25/31]
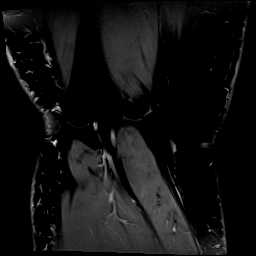
[im 31/31]
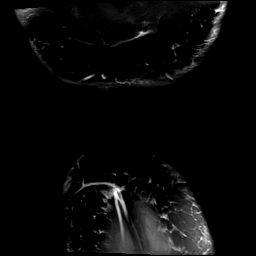

[Series 12: PD fat-sat · sagittal · right · 3.0mm · 0.70mm/px · 8 of 36 slices shown (2 of 2)]
[im 1/36]
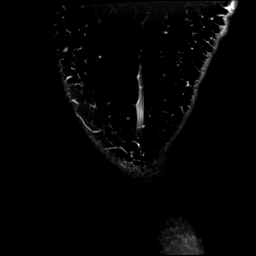
[im 6/36]
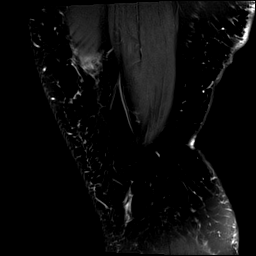
[im 11/36]
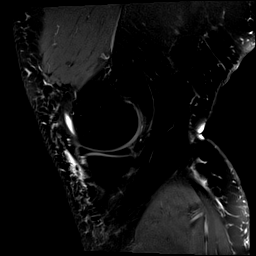
[im 16/36]
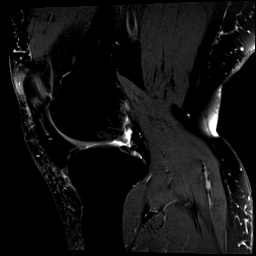
[im 21/36]
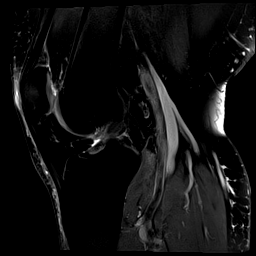
[im 26/36]
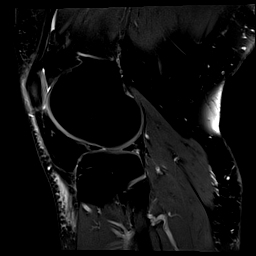
[im 31/36]
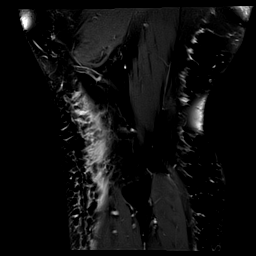
[im 36/36]
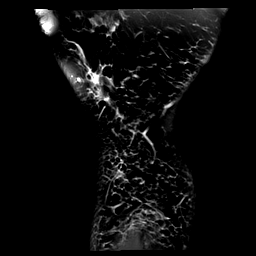

[Series 13: T2 fat-sat · sagittal · right · 3.0mm · 0.70mm/px · 8 of 36 slices shown (2 of 3)]
[im 1/36]
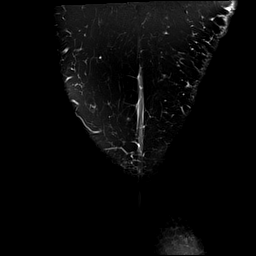
[im 6/36]
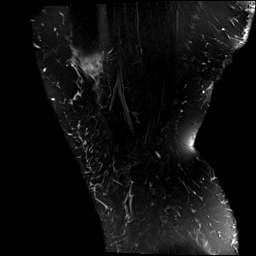
[im 11/36]
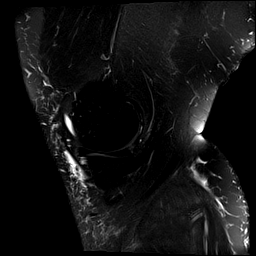
[im 16/36]
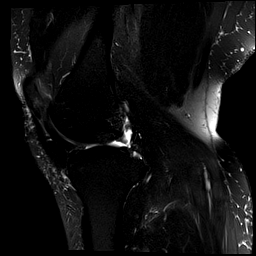
[im 21/36]
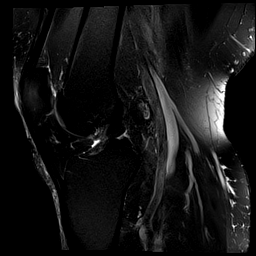
[im 26/36]
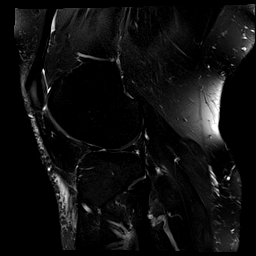
[im 31/36]
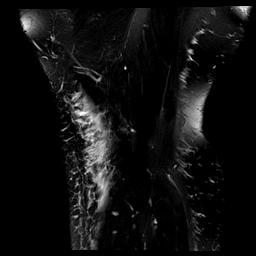
[im 36/36]
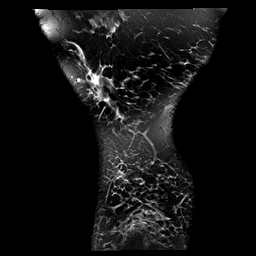

[Series 14: T2 fat-sat · axial · right · 4.0mm · 0.56mm/px · z∈[-33,+90]mm · 5 of 26 slices shown (3 of 3)]
[im 1/26]
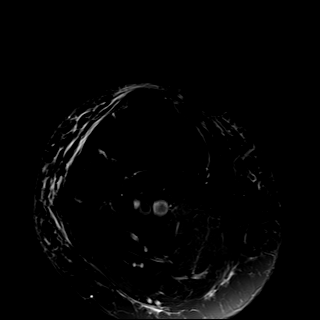
[im 7/26]
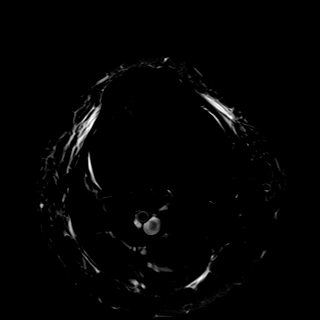
[im 13/26]
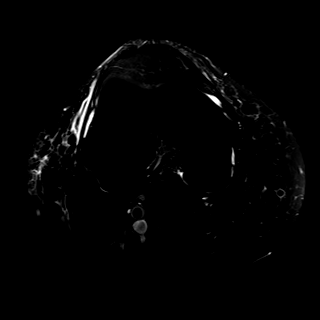
[im 19/26]
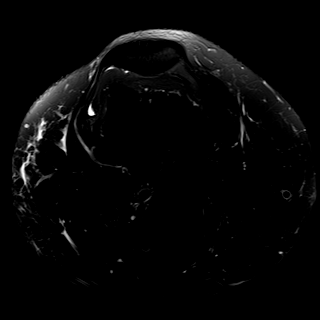
[im 26/26]
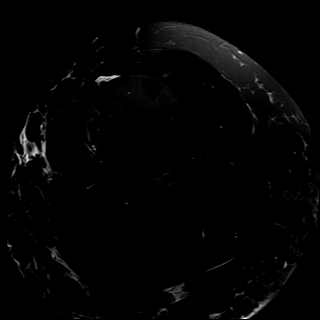

[40 of 40 positions shown; findings below may reference images not displayed]

FINDINGS: MENISCI

Medial meniscus:  Intact.

Lateral meniscus:  Intact.

LIGAMENTS

Cruciates:  Intact.

Collaterals:  Intact.

CARTILAGE

Patellofemoral: Focal partial-thickness fissure in hyaline cartilage
at the apex of the patella in the midpole is identified.

Medial:  Preserved.

Lateral:  Preserved.

Joint:  No effusion.

Popliteal Fossa:  No Baker's cyst.

Extensor Mechanism: Intact. Mild intrasubstance increased T2 signal
is seen throughout the patellar tendon.

Bones:  Normal marrow signal throughout.

Other: None.
IMPRESSION: No acute abnormality.  Negative for meniscal or ligament tear.

Patellar tendinosis without tear.

Focal fissure in hyaline cartilage at the apex the patella in the
midpole.

## 2023-05-07 ENCOUNTER — Other Ambulatory Visit: Payer: Self-pay | Admitting: Orthopedic Surgery

## 2023-05-07 DIAGNOSIS — M75122 Complete rotator cuff tear or rupture of left shoulder, not specified as traumatic: Secondary | ICD-10-CM

## 2023-05-12 ENCOUNTER — Ambulatory Visit
Admission: RE | Admit: 2023-05-12 | Discharge: 2023-05-12 | Disposition: A | Discharge status: Home / Self Care | Payer: Commercial Managed Care - PPO | Source: Ambulatory Visit | Attending: Orthopedic Surgery | Admitting: Orthopedic Surgery

## 2023-05-12 DIAGNOSIS — M75122 Complete rotator cuff tear or rupture of left shoulder, not specified as traumatic: Secondary | ICD-10-CM | POA: Diagnosis present
